# Patient Record
Sex: Male | Born: 1980 | Race: Black or African American | Hispanic: No | Marital: Single | Smoking: Current every day smoker
Health system: Southern US, Community
[De-identification: ages and names within clinical notes are randomized; demographics above are authoritative.]

## PROBLEM LIST (undated history)

## (undated) DIAGNOSIS — E079 Disorder of thyroid, unspecified: Secondary | ICD-10-CM

## (undated) HISTORY — PX: TONSILLECTOMY: SUR1361

---

## 2011-04-01 ENCOUNTER — Encounter: Payer: Self-pay | Admitting: *Deleted

## 2011-04-01 ENCOUNTER — Emergency Department (HOSPITAL_COMMUNITY)
Admission: EM | Admit: 2011-04-01 | Discharge: 2011-04-02 | Disposition: A | Discharge status: Home / Self Care | Payer: Private Health Insurance - Indemnity | Attending: Emergency Medicine | Admitting: Emergency Medicine

## 2011-04-01 DIAGNOSIS — M545 Low back pain, unspecified: Secondary | ICD-10-CM | POA: Insufficient documentation

## 2011-04-01 DIAGNOSIS — M5430 Sciatica, unspecified side: Secondary | ICD-10-CM | POA: Insufficient documentation

## 2011-04-01 DIAGNOSIS — X500XXA Overexertion from strenuous movement or load, initial encounter: Secondary | ICD-10-CM | POA: Insufficient documentation

## 2011-04-01 DIAGNOSIS — F17200 Nicotine dependence, unspecified, uncomplicated: Secondary | ICD-10-CM | POA: Insufficient documentation

## 2011-04-01 DIAGNOSIS — M5431 Sciatica, right side: Secondary | ICD-10-CM

## 2011-04-01 NOTE — ED Notes (Signed)
He has had some lower back pain since the first week of September  The pain went away then returned when he bent over to pick up an object last saturdau.  Since then he has has pain in his rt hip and the pain is going down his rt leg

## 2011-04-02 MED ORDER — HYDROCODONE-ACETAMINOPHEN 5-325 MG PO TABS
1.0000 | ORAL_TABLET | Freq: Four times a day (QID) | ORAL | Status: AC | PRN
Start: 2011-04-02 — End: 2011-04-12

## 2011-04-02 MED ORDER — KETOROLAC TROMETHAMINE 60 MG/2ML IM SOLN
INTRAMUSCULAR | Status: AC
  Administered 2011-04-02: 60 mg via INTRAMUSCULAR
  Filled 2011-04-02: qty 2

## 2011-04-02 MED ORDER — DIAZEPAM 5 MG/ML IJ SOLN
10.0000 mg | Freq: Once | INTRAMUSCULAR | Status: AC
  Administered 2011-04-02: 10 mg via INTRAVENOUS

## 2011-04-02 MED ORDER — KETOROLAC TROMETHAMINE 60 MG/2ML IM SOLN
60.0000 mg | Freq: Once | INTRAMUSCULAR | Status: AC
Start: 2011-04-02 — End: 2011-04-02
  Administered 2011-04-02: 60 mg via INTRAMUSCULAR

## 2011-04-02 MED ORDER — NAPROXEN SODIUM 550 MG PO TABS: 550.0000 mg | ORAL_TABLET | Freq: Two times a day (BID) | ORAL | Status: AC

## 2011-04-02 MED ORDER — DIAZEPAM 5 MG/ML IJ SOLN
INTRAMUSCULAR | Status: AC
  Administered 2011-04-02: 10 mg via INTRAVENOUS
  Filled 2011-04-02: qty 2

## 2011-04-02 NOTE — ED Notes (Signed)
Patient with lower back pain.  Patient states that it started in September and went away after rest.  Patient states he picked up some heavy laundry.  Today having more pain after bending to pick up something.  Unable to sit or lay down.  Patient states that he feels better when standing.

## 2011-04-02 NOTE — ED Provider Notes (Signed)
History     CSN: [id number] Arrival date & time: 04/01/2011 10:38 PM   First MD Initiated Contact with Patient 04/02/11 [phone]      Chief Complaint  Patient presents with  . Back Pain    (Consider location/radiation/quality/duration/timing/severity/associated sxs/prior treatment) HPI This is a 30 year old black male with a ten-day history of right lower back pain. It started after some heavy lifting. It is worsened over the past 3 days and is now moderate to severe. It is described as a burning sensation in his muscles. It is worse with sitting or with movement of the right lower extremity at the hip. It is not worsened with palpation of the back. There is no associated numbness or weakness nor has there been any change in bowel or bladder habits. He had a similar episode in September that was milder and resolved on its own.  History reviewed. No pertinent past medical history.  History reviewed. No pertinent past surgical history.  History reviewed. No pertinent family history.  History  Substance Use Topics  . Smoking status: Current Everyday Smoker  . Smokeless tobacco: Not on file  . Alcohol Use: Yes      Review of Systems  All other systems reviewed and are negative.    Allergies  Review of patient's allergies indicates no known allergies.  Home Medications  No current outpatient prescriptions on file.  BP 125/90  Pulse 66  Temp(Src) 98.3 F (36.8 C) (Oral)  Resp 20  SpO2 100%  Physical Exam General: Well-developed, well-nourished male in no acute distress; appearance consistent with age of record HENT: normocephalic, atraumatic Eyes: Normal appearance Neck: supple Heart: regular rate and rhythm Lungs: clear to auscultation bilaterally Abdomen: soft; nondistended Back: No lumbar spinal or paraspinal tenderness Extremities: No deformity; decreased range of motion her right hip due to pain Neurologic: Awake, alert and oriented; motor function intact in  all extremities and symmetric although evaluation of strength in the right hip joint is limited due to pain; no facial droop; sensation normal Skin: Warm and dry     ED Course  Procedures (including critical care time)    MDM  History and exam consistent with sciatica.         Hanley Seamen, MD 04/02/11 [phone]

## 2011-04-02 NOTE — ED Notes (Signed)
Pt rounded on while in waiting room. Pt is on his feet. Pt updated on his wait and verbalized understanding. Pt smiling.

## 2014-03-28 ENCOUNTER — Other Ambulatory Visit: Payer: Self-pay | Admitting: Family Medicine

## 2014-03-28 DIAGNOSIS — R634 Abnormal weight loss: Secondary | ICD-10-CM

## 2014-03-28 DIAGNOSIS — E010 Iodine-deficiency related diffuse (endemic) goiter: Secondary | ICD-10-CM

## 2014-04-05 ENCOUNTER — Ambulatory Visit
Admission: RE | Admit: 2014-04-05 | Discharge: 2014-04-05 | Disposition: A | Discharge status: Home / Self Care | Payer: Private Health Insurance - Indemnity | Source: Ambulatory Visit | Attending: Family Medicine | Admitting: Family Medicine

## 2014-04-05 DIAGNOSIS — E010 Iodine-deficiency related diffuse (endemic) goiter: Secondary | ICD-10-CM

## 2014-04-05 DIAGNOSIS — R634 Abnormal weight loss: Secondary | ICD-10-CM

## 2015-05-23 ENCOUNTER — Emergency Department (HOSPITAL_COMMUNITY)
Admission: EM | Admit: 2015-05-23 | Discharge: 2015-05-23 | Disposition: A | Discharge status: Home / Self Care | Payer: Managed Care, Other (non HMO) | Attending: Emergency Medicine | Admitting: Emergency Medicine

## 2015-05-23 ENCOUNTER — Encounter (HOSPITAL_COMMUNITY): Payer: Self-pay | Admitting: Emergency Medicine

## 2015-05-23 ENCOUNTER — Emergency Department (HOSPITAL_COMMUNITY): Payer: Managed Care, Other (non HMO)

## 2015-05-23 DIAGNOSIS — Z8639 Personal history of other endocrine, nutritional and metabolic disease: Secondary | ICD-10-CM | POA: Diagnosis not present

## 2015-05-23 DIAGNOSIS — R197 Diarrhea, unspecified: Secondary | ICD-10-CM | POA: Insufficient documentation

## 2015-05-23 DIAGNOSIS — R0602 Shortness of breath: Secondary | ICD-10-CM | POA: Diagnosis present

## 2015-05-23 DIAGNOSIS — R739 Hyperglycemia, unspecified: Secondary | ICD-10-CM | POA: Insufficient documentation

## 2015-05-23 DIAGNOSIS — F17200 Nicotine dependence, unspecified, uncomplicated: Secondary | ICD-10-CM | POA: Diagnosis not present

## 2015-05-23 DIAGNOSIS — J209 Acute bronchitis, unspecified: Secondary | ICD-10-CM | POA: Insufficient documentation

## 2015-05-23 DIAGNOSIS — R000 Tachycardia, unspecified: Secondary | ICD-10-CM | POA: Diagnosis not present

## 2015-05-23 HISTORY — DX: Disorder of thyroid, unspecified: E07.9

## 2015-05-23 LAB — BASIC METABOLIC PANEL
ANION GAP: 10 (ref 5–15)
BUN: 12 mg/dL (ref 6–20)
CALCIUM: 9.6 mg/dL (ref 8.9–10.3)
CO2: 22 mmol/L (ref 22–32)
Chloride: 103 mmol/L (ref 101–111)
Creatinine, Ser: 0.59 mg/dL — ABNORMAL LOW (ref 0.61–1.24)
Glucose, Bld: 147 mg/dL — ABNORMAL HIGH (ref 65–99)
POTASSIUM: 3.9 mmol/L (ref 3.5–5.1)
Sodium: 135 mmol/L (ref 135–145)

## 2015-05-23 LAB — CBC
HEMATOCRIT: 44 % (ref 39.0–52.0)
HEMOGLOBIN: 13.7 g/dL (ref 13.0–17.0)
MCH: 27.5 pg (ref 26.0–34.0)
MCHC: 31.1 g/dL (ref 30.0–36.0)
MCV: 88.4 fL (ref 78.0–100.0)
Platelets: 192 10*3/uL (ref 150–400)
RBC: 4.98 MIL/uL (ref 4.22–5.81)
RDW: 12.8 % (ref 11.5–15.5)
WBC: 6.3 10*3/uL (ref 4.0–10.5)

## 2015-05-23 LAB — I-STAT TROPONIN, ED: TROPONIN I, POC: 0 ng/mL (ref 0.00–0.08)

## 2015-05-23 MED ORDER — ALBUTEROL SULFATE HFA 108 (90 BASE) MCG/ACT IN AERS
2.0000 | INHALATION_SPRAY | RESPIRATORY_TRACT | Status: DC | PRN
  Administered 2015-05-23: 2 via RESPIRATORY_TRACT
  Filled 2015-05-23: qty 6.7

## 2015-05-23 MED ORDER — AEROCHAMBER PLUS W/MASK MISC
1.0000 | Freq: Once | Status: AC
  Administered 2015-05-23: 1
  Filled 2015-05-23: qty 1

## 2015-05-23 NOTE — ED Notes (Signed)
EKG given to MD

## 2015-05-23 NOTE — Progress Notes (Signed)
Pt states his pcp is dorothy ferguson [facility] updated

## 2015-05-23 NOTE — Discharge Instructions (Signed)
Acute Bronchitis  Avoid milk or foods containing milk such as cheese or ice cream while having diarrhea. It is okay to use Imodium as directed for diarrhea. Drink lots of fluids. Drink at least six 8 ounce glasses of water or Gatorade each day. [facility] to schedule an appointment to be seen in one or 2 days to get your heart rate rechecked. Your blood sugar is mildly elevated today 147. You may be borderline diabetic ask your doctor to check a test known as hemoglobin A1c to check you for diabetes Ask your primary care doctor to help you to stop smoking. Return if you feel worse for any reason . Bronchitis is when the airways that extend from the windpipe into the lungs get red, puffy, and painful (inflamed). Bronchitis often causes thick spit (mucus) to develop. This leads to a cough. A cough is the most common symptom of bronchitis. In acute bronchitis, the condition usually begins suddenly and goes away over time (usually in 2 weeks). Smoking, allergies, and asthma can make bronchitis worse. Repeated episodes of bronchitis may cause more lung problems. HOME CARE  Rest.  Drink enough fluids to keep your pee (urine) clear or pale yellow (unless you need to limit fluids as told by your doctor).  Only take over-the-counter or prescription medicines as told by your doctor.  Avoid smoking and secondhand smoke. These can make bronchitis worse. If you are a smoker, think about using nicotine gum or skin patches. Quitting smoking will help your lungs heal faster.  Reduce the chance of getting bronchitis again by:  Washing your hands often.  Avoiding people with cold symptoms.  Trying not to touch your hands to your mouth, nose, or eyes.  Follow up with your doctor as told. GET HELP IF: Your symptoms do not improve after 1 week of treatment. Symptoms include:  Cough.  Fever.  Coughing up thick spit.  Body aches.  Chest congestion.  Chills.  Shortness  of breath.  Sore throat. GET HELP RIGHT AWAY IF:   You have an increased fever.  You have chills.  You have severe shortness of breath.  You have bloody thick spit (sputum).  You throw up (vomit) often.  You lose too much body fluid (dehydration).  You have a severe headache.  You faint. MAKE SURE YOU:   Understand these instructions.  Will watch your condition.  Will get help right away if you are not doing well or get worse.   This information is not intended to replace advice given to you by your health care provider. Make sure you discuss any questions you have with your health care provider.   Document Released: 10/16/2007 Document Revised: 12/30/2012 Document Reviewed: 10/20/2012 Elsevier Interactive Patient Education [employer]2016 [employer]

## 2015-05-23 NOTE — ED Notes (Signed)
MD said patient is free to leave.  However, he stated for patient not to have the albuterol.

## 2015-05-23 NOTE — ED Provider Notes (Addendum)
CSN: [id number]     Arrival date & time 05/23/15  2130 History   First MD Initiated Contact with Patient 05/23/15 1127     Chief Complaint  Patient presents with  . Chest Pain  . Shortness of Breath  . Diarrhea     (Consider location/radiation/quality/duration/timing/severity/associated sxs/prior Treatment) HPI Complains of cough, nonproductive and chest pain anterior  with coughing accompanied by nasal congestion onset 4 days ago. He denies any fever. No other associated symptoms. No treatment prior to coming here. Chest pain is worse with coughing. He does not have chest pain without coughing. No other associated symptoms Past Medical History  Diagnosis Date  . Thyroid disease    Graves' disease Past Surgical History  Procedure Laterality Date  . Tonsillectomy     History reviewed. No pertinent family history. Social History  Substance Use Topics  . Smoking status: Current Every Day Smoker  . Smokeless tobacco: None  . Alcohol Use: Yes    Review of Systems  Constitutional: Negative.   HENT: Negative.   Respiratory: Positive for cough and shortness of breath.   Cardiovascular: Positive for chest pain.       Resting tachycardia  Gastrointestinal: Negative.   Musculoskeletal: Negative.   Skin: Negative.   Neurological: Negative.   Psychiatric/Behavioral: Negative.   All other systems reviewed and are negative.     Allergies  Review of patient's allergies indicates no known allergies.  Home Medications   Prior to Admission medications   Not on File   BP 149/80 mmHg  Pulse 104  Temp(Src) 98 F (36.7 C) (Oral)  Resp 17  SpO2 94% Physical Exam  Constitutional: He appears well-developed and well-nourished.  HENT:  Head: Normocephalic and atraumatic.  Eyes: Conjunctivae are normal. Pupils are equal, round, and reactive to light.  Neck: Neck supple. No tracheal deviation present. No thyromegaly present.  Cardiovascular: Regular rhythm.   No murmur  heard. Mildly tachycardic  Pulmonary/Chest: Effort normal and breath sounds normal.  Abdominal: Soft. Bowel sounds are normal. He exhibits no distension. There is no tenderness.  Musculoskeletal: Normal range of motion. He exhibits no edema or tenderness.  Neurological: He is alert. Coordination normal.  Skin: Skin is warm and dry. No rash noted.  Psychiatric: He has a normal mood and affect.  Nursing note and vitals reviewed.   ED Course  Procedures (including critical care time) Labs Review Labs Reviewed  BASIC METABOLIC PANEL - Abnormal; Notable for the following:    Glucose, Bld 147 (*)    Creatinine, Ser 0.59 (*)    All other components within normal limits  CBC  I-STAT TROPOININ, ED    Imaging Review Dg Chest 2 View  05/23/2015  CLINICAL DATA:  Cough, congestion starting Friday EXAM: CHEST  2 VIEW COMPARISON:  None. FINDINGS: Cardiomediastinal silhouette is unremarkable. No acute infiltrate or pulmonary edema. Bilateral infrahilar bronchitic changes are noted. Bony thorax is unremarkable. IMPRESSION: No acute infiltrate or pulmonary edema. Bilateral infrahilar bronchitic changes. Electronically Signed   By: Natasha Mead M.D.   On: 05/23/2015 10:02   I have personally reviewed and evaluated these images and lab results as part of my medical decision-making.   EKG Interpretation   Date/Time:  Tuesday May 23 2015 09:28:33 EST Ventricular Rate:  96 PR Interval:  165 QRS Duration: 90 QT Interval:  358 QTC Calculation: 452 R Axis:   72 Text Interpretation:  Sinus rhythm Right atrial enlargement Borderline T  abnormalities, inferior leads Baseline wander in lead(s) II  III aVF V2 No  old tracing to compare Confirmed by Ethelda ChickJACUBOWITZ  MD, Mckade Gurka [phone]([id number]) on  05/23/2015 9:42:48 AM     ED ECG REPORT   Date: 05/23/2015   1239 pm  Rate: 135  Rhythm: sinus tachycardia  QRS Axis: normal  Intervals: normal  ST/T Wave abnormalities: nonspecific T wave changes  Conduction  Disutrbances:none  Narrative Interpretation:   Old EKG Reviewed: Rate increased over previous tracing from earlier today  I have personally reviewed the EKG tracing and agree with the computerized printout as noted.  12:10 PM patient noted to be tachycardic approximately 1 40 bpm, after treatment with albuterol. Sinus tachycardia. He is asymptomatic with it, not lightheaded. He reports that he normally runs a rapid heartbeat secondary to thyroid disease.  At 12:50 PM patient's heart rate had come down to approximately 125 to 1 beats per minute, sinus tachycardia. Results for orders placed or performed during the hospital encounter of 05/23/15  Basic metabolic panel  Result Value Ref Range   Sodium 135 135 - 145 mmol/L   Potassium 3.9 3.5 - 5.1 mmol/L   Chloride 103 101 - 111 mmol/L   CO2 22 22 - 32 mmol/L   Glucose, Bld 147 (H) 65 - 99 mg/dL   BUN 12 6 - 20 mg/dL   Creatinine, Ser 6.040.59 (L) 0.61 - 1.24 mg/dL   Calcium 9.6 8.9 - 54.010.3 mg/dL   GFR calc non Af Amer >60 >60 mL/min   GFR calc Af Amer >60 >60 mL/min   Anion gap 10 5 - 15  CBC  Result Value Ref Range   WBC 6.3 4.0 - 10.5 K/uL   RBC 4.98 4.22 - 5.81 MIL/uL   Hemoglobin 13.7 13.0 - 17.0 g/dL   HCT 98.144.0 19.139.0 - 47.852.0 %   MCV 88.4 78.0 - 100.0 fL   MCH 27.5 26.0 - 34.0 pg   MCHC 31.1 30.0 - 36.0 g/dL   RDW 29.512.8 62.111.5 - 30.815.5 %   Platelets 192 150 - 400 K/uL  [facility] troponin[facility])  Result Value Ref Range   Troponin i, poc 0.00 0.00 - 0.08 ng/mL   Comment 3           Dg Chest 2 View  05/23/2015  CLINICAL DATA:  Cough, congestion starting Friday EXAM: CHEST  2 VIEW COMPARISON:  None. FINDINGS: Cardiomediastinal silhouette is unremarkable. No acute infiltrate or pulmonary edema. Bilateral infrahilar bronchitic changes are noted. Bony thorax is unremarkable. IMPRESSION: No acute infiltrate or pulmonary edema. Bilateral infrahilar bronchitic changes. Electronically Signed   By: Natasha MeadLiviu  Pop M.D.   On: 05/23/2015 10:02    councilled pt for 5 minutes on smoking cessation MDM   tachycardia likely secondary to mild dehydration, beta adrenergics and patient's underlying thyroid disease Spoke with Dr.Reade she should call office to be seen within the next 1 or 2 days We will withhold albuterol HFA Final diagnoses:  None  Dx#1 acute bronchitis #2 diarrhea #3 tobacco abuse # hyperglycemia       Doug SouSam Arvis Zwahlen, MD 05/23/15 1259  Doug SouSam Calan Doren, MD 05/31/15 [phone]  Doug SouSam Mellany Dinsmore, MD 05/31/15 2350

## 2015-05-23 NOTE — ED Notes (Signed)
MD said patient doesn't need to be on heart monitor

## 2015-05-23 NOTE — ED Notes (Signed)
Pt states he began having a cough, nasal congestion, runny nose, with chest soreness with coughing since this past Friday. Pt states he's been having SOB, diarrhea, nausea and vomiting, denies fever. Vitals stable in triage, does not appear to be in obvious distress.

## 2016-04-03 IMAGING — US US SOFT TISSUE HEAD/NECK
1 series · 14 of 25 positions shown · non-contrast
Comparison: None.

CLINICAL DATA: Thyromegaly

EXAM:
THYROID ULTRASOUND
TECHNIQUE: Ultrasound examination of the thyroid gland and adjacent soft
tissues was performed.

[Series 1: us soft tissue head/neck · 0.11mm/px · 14 of 32 slices shown]
[im 1/32]
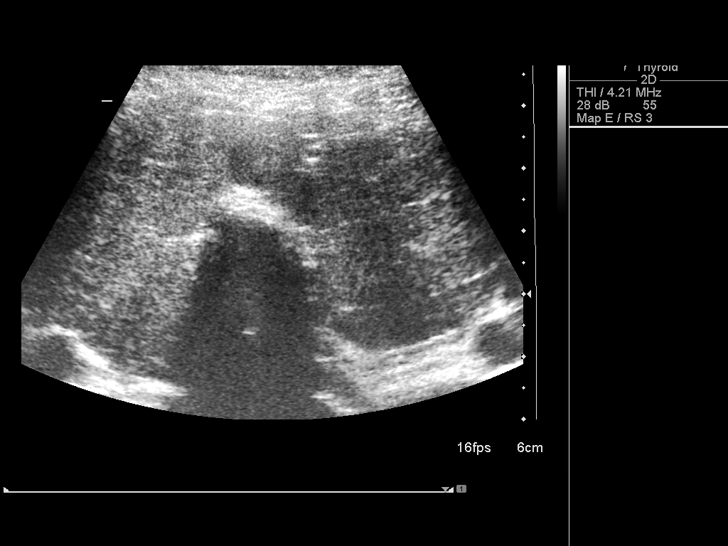
[im 3/32]
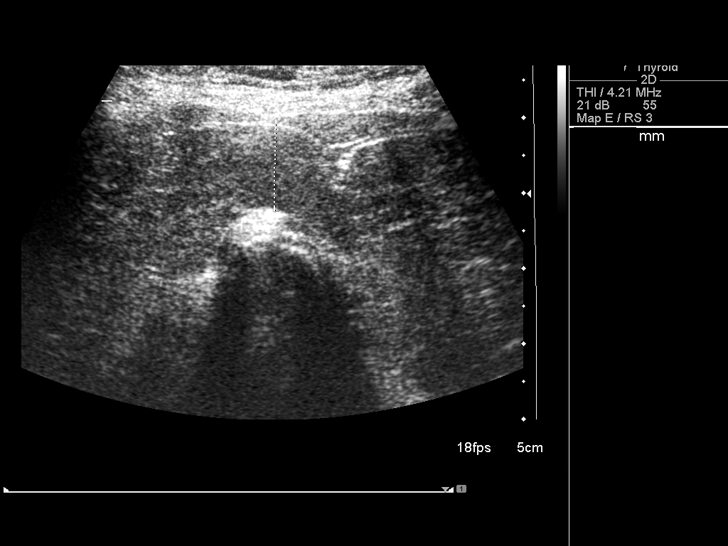
[im 6/32]
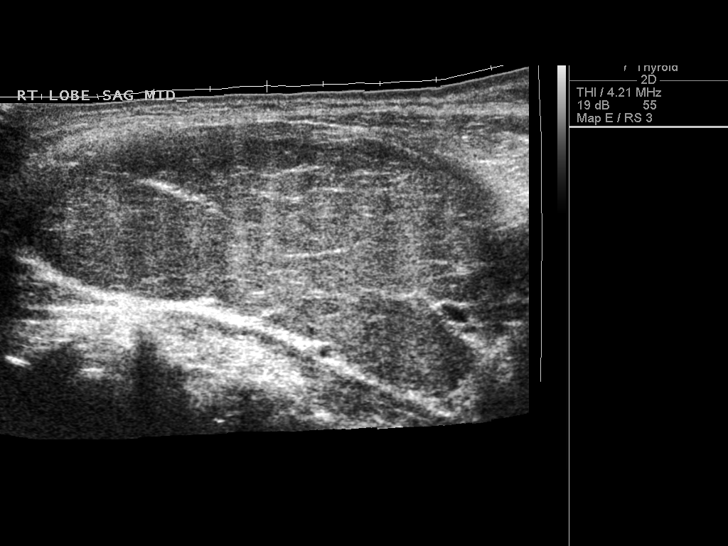
[im 8/32]
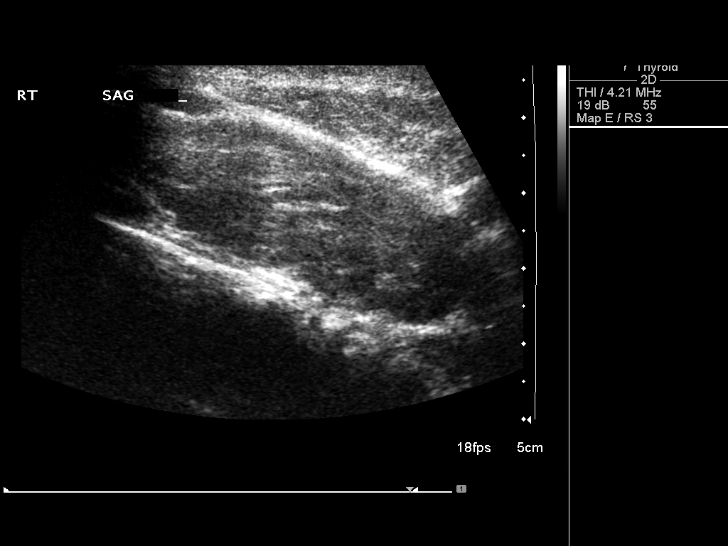
[im 11/32]
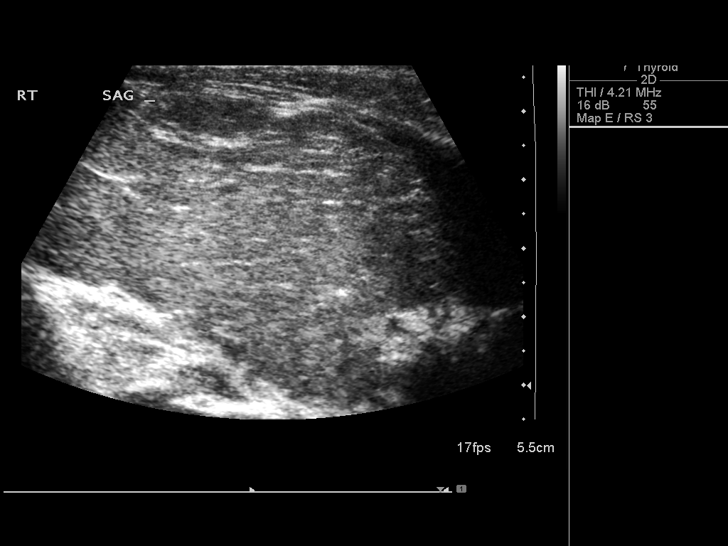
[im 12/32]
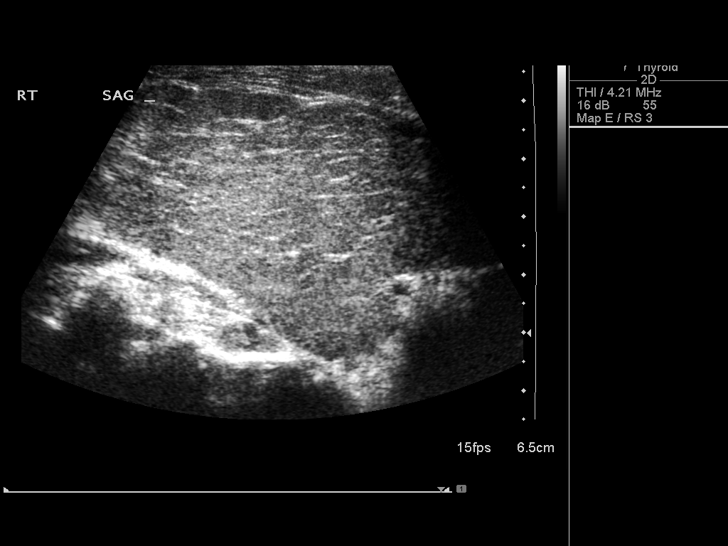
[im 15/32]
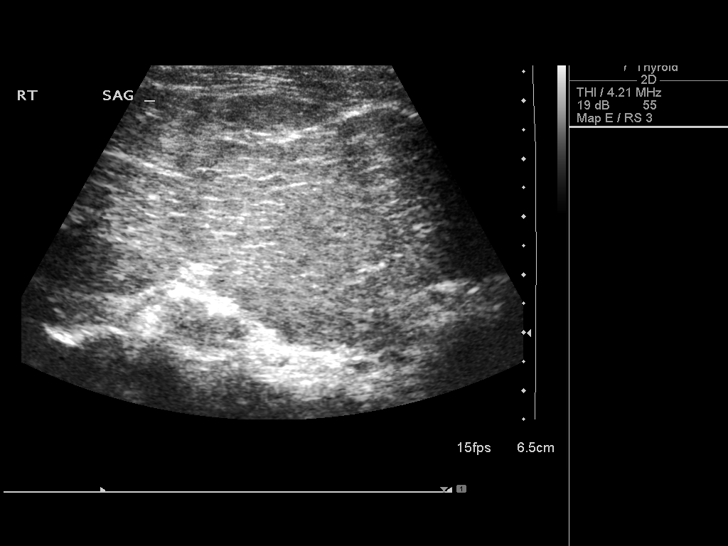
[im 17/32]
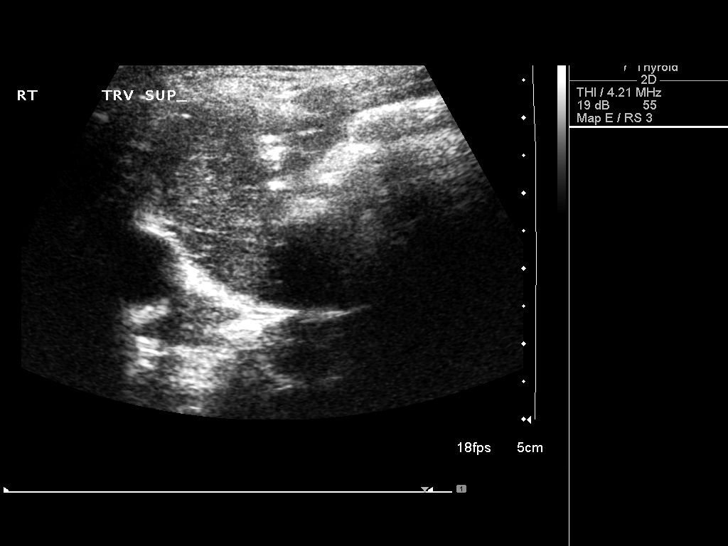
[im 20/32]
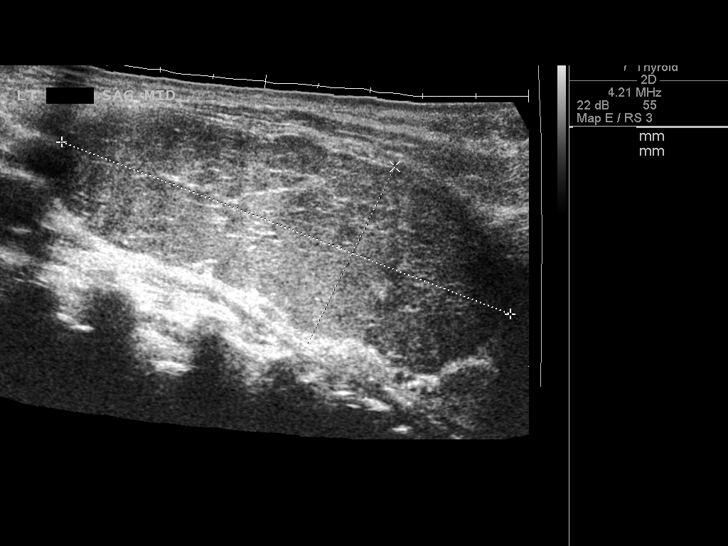
[im 21/32]
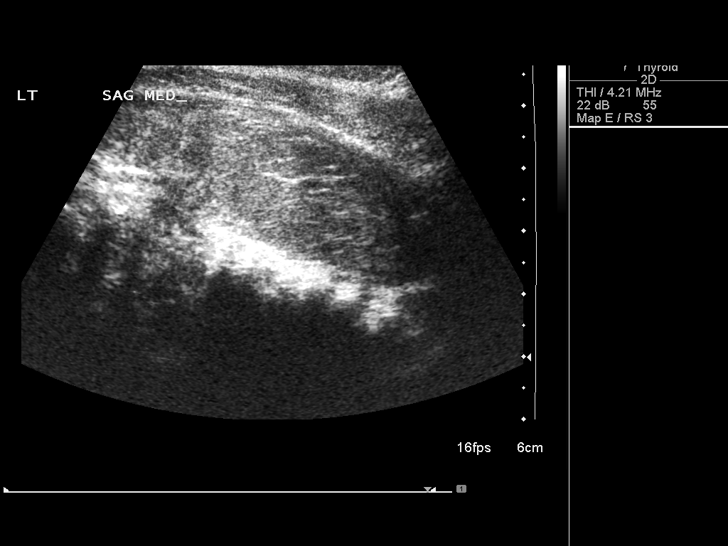
[im 24/32]
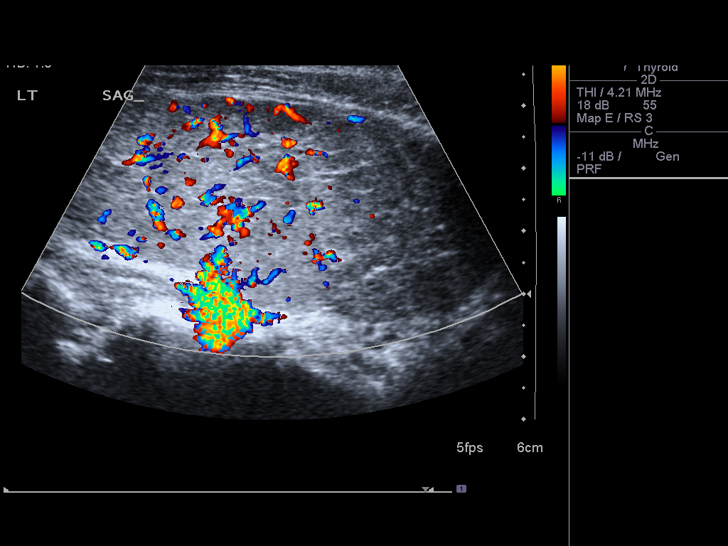
[im 26/32]
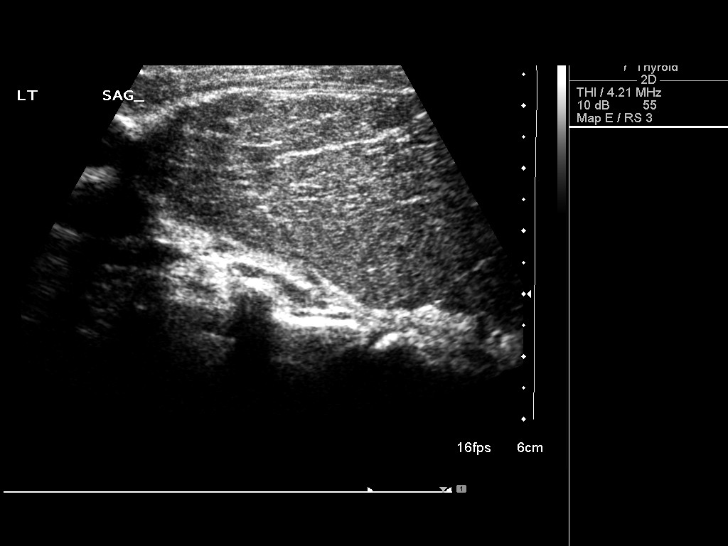
[im 29/32]
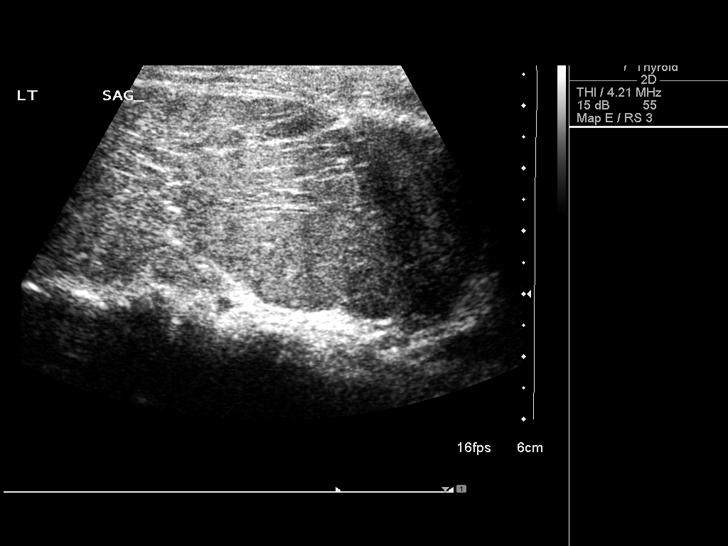
[im 32/32]
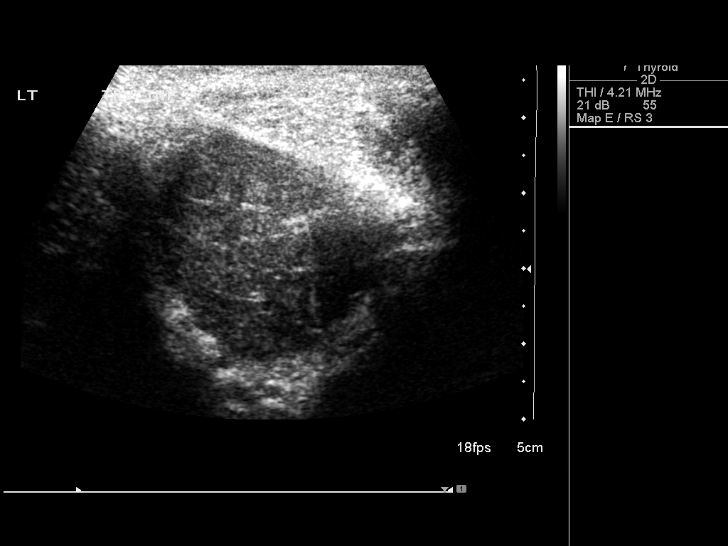

[14 of 25 positions shown; findings below may reference images not displayed]

FINDINGS: Right thyroid lobe

Measurements: 8.7 x 3.9 x 3.9 cm. Mild heterogeneous hypoechoic
enlargement with scattered echogenic septations. Mild increased
vascularity diffusely. No focal nodule or mass.

Left thyroid lobe

Measurements: 9.1 x 3.7 x 3.6 cm. Similar diffuse hypoechoic
enlargement with echogenic septations and slight increased
vascularity. No focal nodule or mass..

Isthmus

Thickness: 1.2 cm.  No nodules visualized.

Lymphadenopathy

none.
IMPRESSION: Diffusely enlarged hypoechoic gland with echogenic septations and
mild increased vascularity compatible with thyroiditis.

No focal abnormality.
